# Patient Record
Sex: Female | Born: 1970 | Race: White | Hispanic: No
Health system: Southern US, Community
[De-identification: ages and names within clinical notes are randomized; demographics above are authoritative.]

---

## 2007-08-31 ENCOUNTER — Ambulatory Visit: Payer: Self-pay | Admitting: Gastroenterology

## 2010-09-13 ENCOUNTER — Ambulatory Visit: Payer: Self-pay | Admitting: Family Medicine

## 2011-07-15 ENCOUNTER — Emergency Department: Payer: Self-pay | Admitting: Emergency Medicine

## 2012-02-22 ENCOUNTER — Ambulatory Visit: Payer: Self-pay | Admitting: Family Medicine

## 2012-10-24 ENCOUNTER — Emergency Department: Payer: Self-pay | Admitting: Emergency Medicine

## 2013-08-07 ENCOUNTER — Ambulatory Visit: Payer: Self-pay | Admitting: Family Medicine

## 2013-08-25 ENCOUNTER — Ambulatory Visit: Payer: Self-pay | Admitting: Physician Assistant

## 2013-10-04 ENCOUNTER — Ambulatory Visit: Payer: Self-pay | Admitting: Gastroenterology

## 2017-11-11 ENCOUNTER — Other Ambulatory Visit: Payer: Self-pay | Admitting: Family Medicine

## 2017-11-11 DIAGNOSIS — Z1231 Encounter for screening mammogram for malignant neoplasm of breast: Secondary | ICD-10-CM

## 2017-11-21 ENCOUNTER — Inpatient Hospital Stay: Admission: RE | Admit: 2017-11-21 | Payer: Self-pay | Source: Ambulatory Visit

## 2017-11-29 ENCOUNTER — Ambulatory Visit
Admission: RE | Admit: 2017-11-29 | Discharge: 2017-11-29 | Disposition: A | Discharge status: Home / Self Care | Payer: BLUE CROSS/BLUE SHIELD | Source: Ambulatory Visit | Attending: Family Medicine | Admitting: Family Medicine

## 2017-11-29 ENCOUNTER — Encounter: Payer: Self-pay | Admitting: Radiology

## 2017-11-29 DIAGNOSIS — Z1231 Encounter for screening mammogram for malignant neoplasm of breast: Secondary | ICD-10-CM | POA: Insufficient documentation

## 2017-11-29 DIAGNOSIS — R928 Other abnormal and inconclusive findings on diagnostic imaging of breast: Secondary | ICD-10-CM | POA: Diagnosis not present

## 2017-12-01 ENCOUNTER — Other Ambulatory Visit: Payer: Self-pay | Admitting: Family Medicine

## 2017-12-01 DIAGNOSIS — N6489 Other specified disorders of breast: Secondary | ICD-10-CM

## 2017-12-01 DIAGNOSIS — R928 Other abnormal and inconclusive findings on diagnostic imaging of breast: Secondary | ICD-10-CM

## 2017-12-09 ENCOUNTER — Ambulatory Visit
Admission: RE | Admit: 2017-12-09 | Discharge: 2017-12-09 | Disposition: A | Discharge status: Home / Self Care | Payer: BLUE CROSS/BLUE SHIELD | Source: Ambulatory Visit | Attending: Family Medicine | Admitting: Family Medicine

## 2017-12-09 DIAGNOSIS — N6489 Other specified disorders of breast: Secondary | ICD-10-CM

## 2017-12-09 DIAGNOSIS — R928 Other abnormal and inconclusive findings on diagnostic imaging of breast: Secondary | ICD-10-CM | POA: Diagnosis present

## 2019-04-10 IMAGING — MG MM DIGITAL DIAGNOSTIC BILAT W/ TOMO W/ CAD
8 series · 8 of 24 positions shown · non-contrast
Comparison: Previous exam(s).

CLINICAL DATA: Bilateral breast asymmetry seen on most recent
screening mammography.

EXAM:
DIGITAL DIAGNOSTIC BILATERAL MAMMOGRAM WITH CAD AND TOMO

[R CC synth-2D]
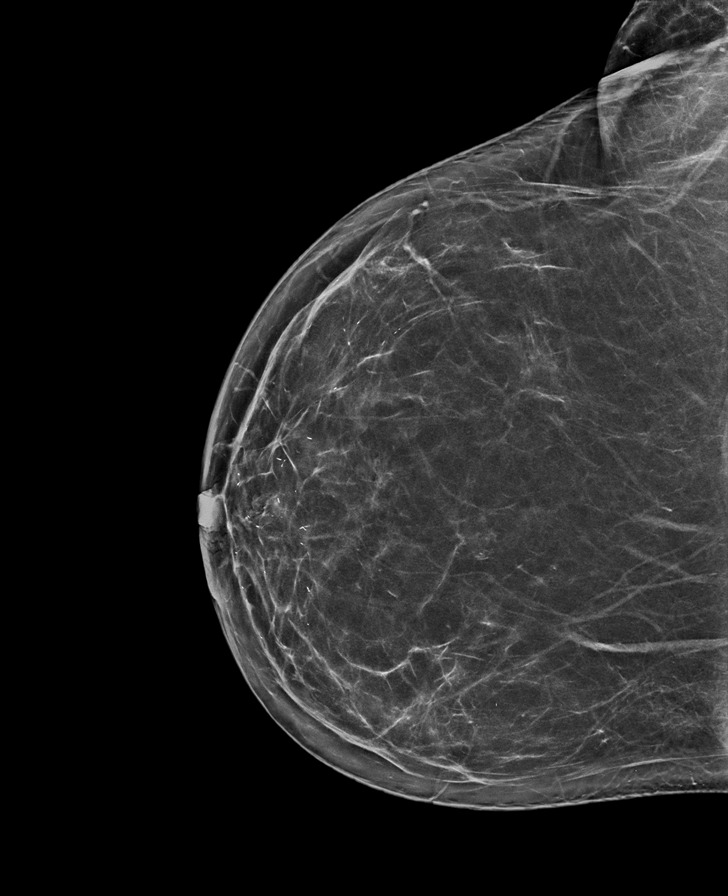

[R MLO synth-2D]
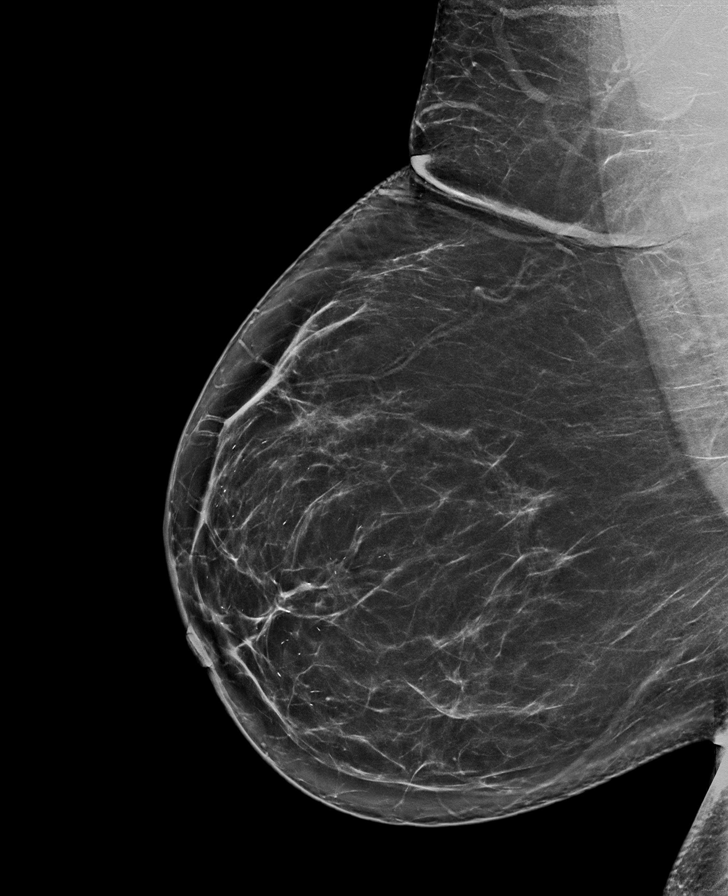

[L MLO synth-2D]
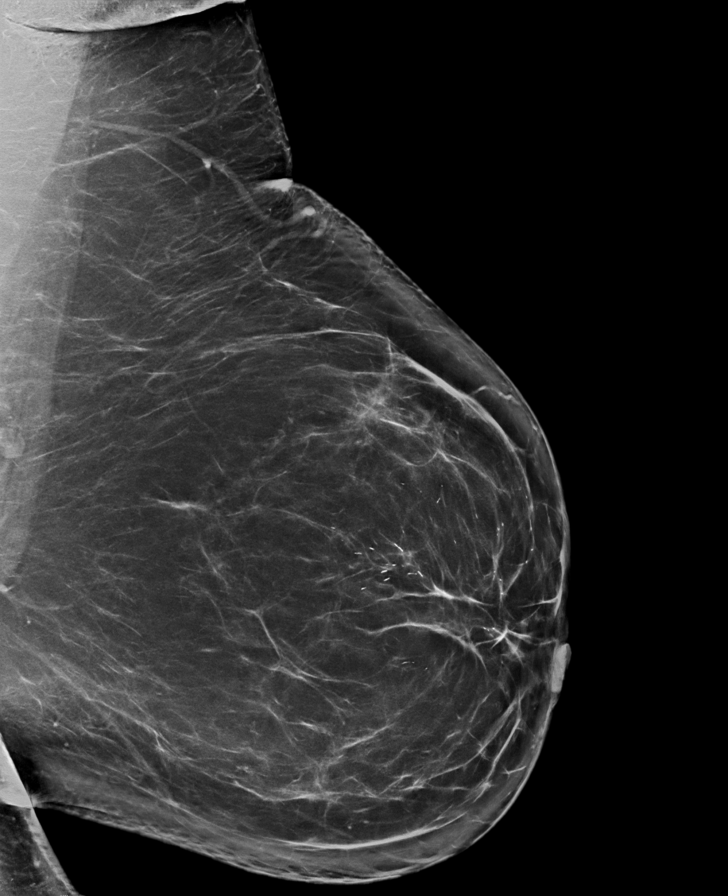

[L CC synth-2D]
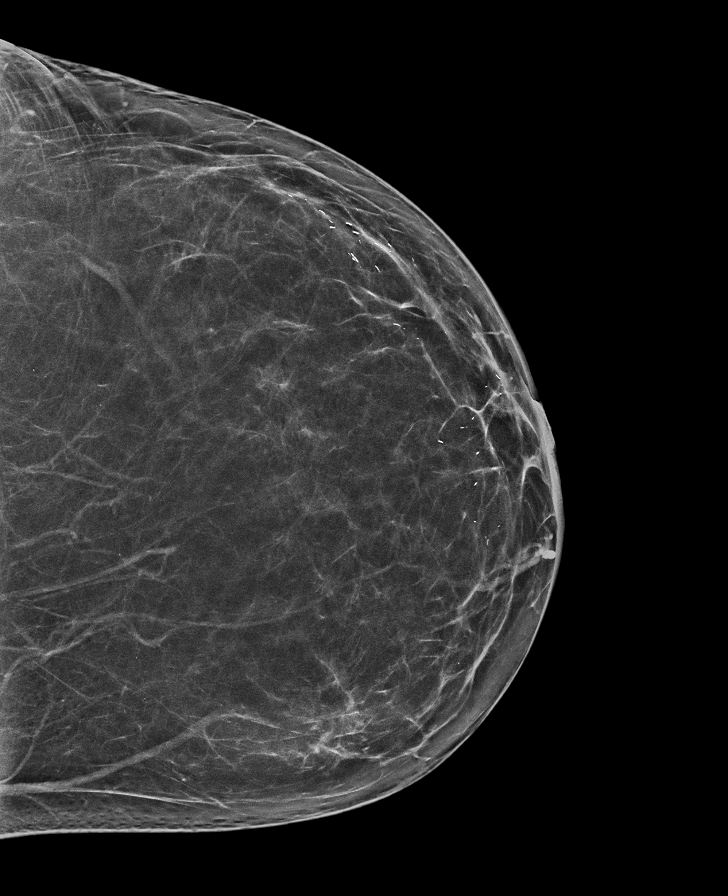

[L CC tomo · tomo slice 39/76.0]
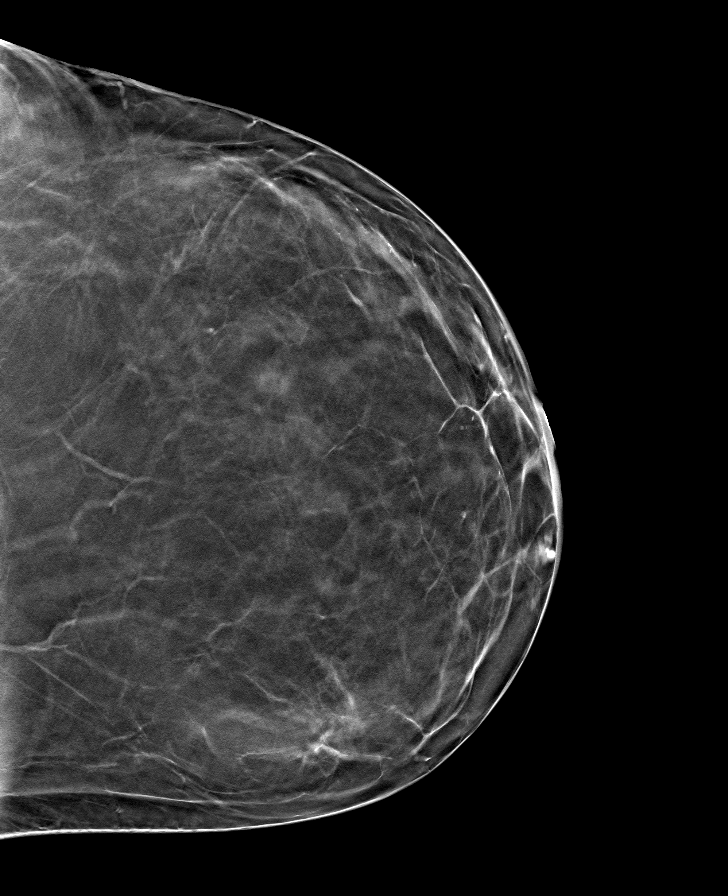

[R CC tomo · tomo slice 39/76.0]
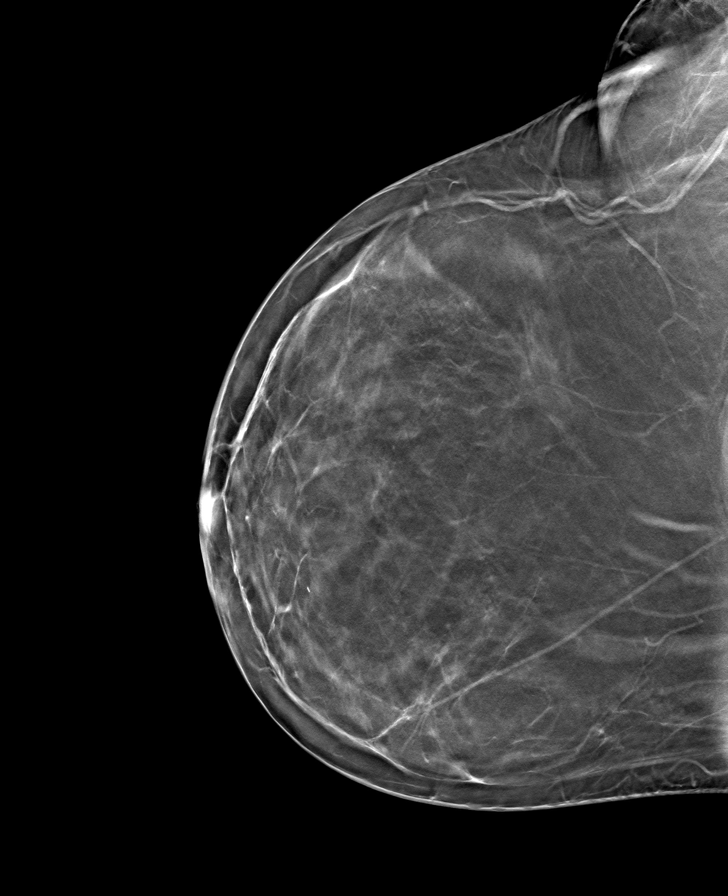

[R MLO tomo · tomo slice 43/85.0]
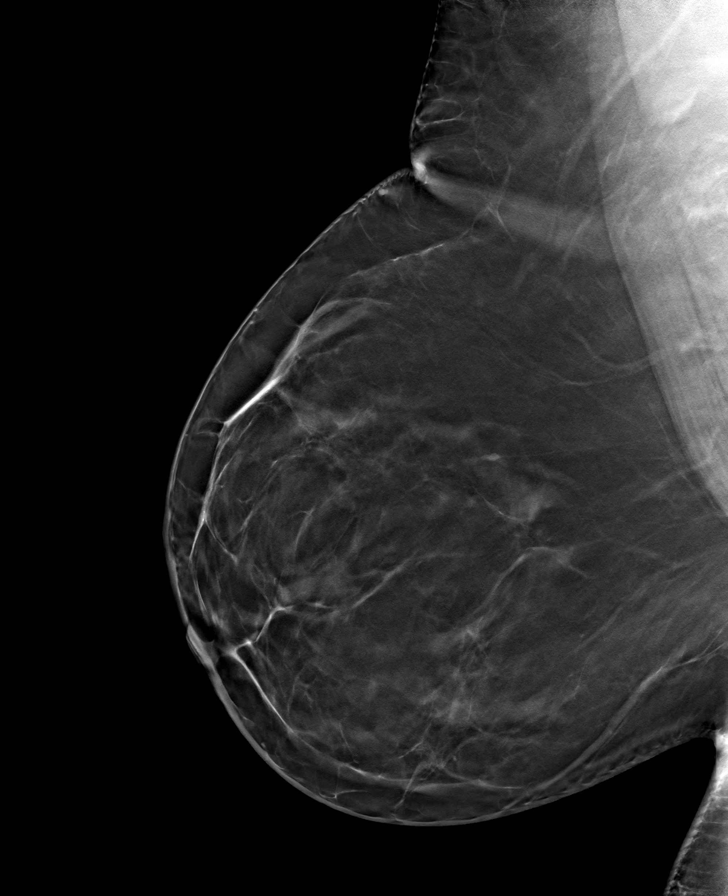

[L MLO tomo · tomo slice 49/96.0]
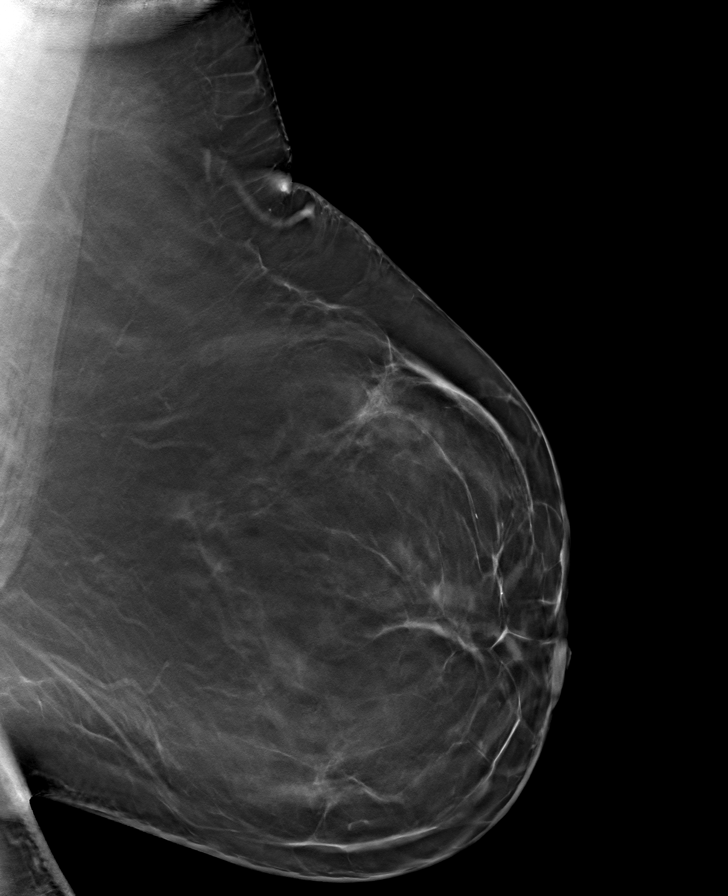

[8 of 24 positions shown; findings below may reference images not displayed]

ACR Breast Density Category b: There are scattered areas of
fibroglandular density.
FINDINGS: Mammographically, there are no suspicious masses, areas of
architectural distortion or microcalcifications in either breast.
The bilateral breast asymmetries noted on the most recent screening
mammogram is stable from patient's mammogram dated 02/22/2012, and
therefore are benign.

Mammographic images were processed with CAD.
IMPRESSION: No mammographic evidence of malignancy in either breast.

RECOMMENDATION:
Screening mammogram in one year.(Code:S8-R-4UD)

I have discussed the findings and recommendations with the patient.
Results were also provided in writing at the conclusion of the
visit. If applicable, a reminder letter will be sent to the patient
regarding the next appointment.

BI-RADS CATEGORY  1: Negative.

## 2019-08-31 ENCOUNTER — Other Ambulatory Visit (HOSPITAL_COMMUNITY): Payer: Self-pay | Admitting: Gastroenterology

## 2019-08-31 ENCOUNTER — Other Ambulatory Visit: Payer: Self-pay | Admitting: Gastroenterology

## 2019-08-31 DIAGNOSIS — R1011 Right upper quadrant pain: Secondary | ICD-10-CM

## 2019-08-31 DIAGNOSIS — R1901 Right upper quadrant abdominal swelling, mass and lump: Secondary | ICD-10-CM

## 2019-09-05 ENCOUNTER — Other Ambulatory Visit: Payer: Self-pay

## 2019-09-05 ENCOUNTER — Ambulatory Visit
Admission: RE | Admit: 2019-09-05 | Discharge: 2019-09-05 | Disposition: A | Discharge status: Home / Self Care | Payer: Managed Care, Other (non HMO) | Source: Ambulatory Visit | Attending: Gastroenterology | Admitting: Gastroenterology

## 2019-09-05 DIAGNOSIS — R1901 Right upper quadrant abdominal swelling, mass and lump: Secondary | ICD-10-CM

## 2019-09-05 DIAGNOSIS — R1011 Right upper quadrant pain: Secondary | ICD-10-CM | POA: Diagnosis not present

## 2021-08-05 ENCOUNTER — Other Ambulatory Visit: Payer: Self-pay | Admitting: Gastroenterology

## 2021-08-05 DIAGNOSIS — R1011 Right upper quadrant pain: Secondary | ICD-10-CM

## 2021-08-11 ENCOUNTER — Other Ambulatory Visit: Payer: Self-pay

## 2021-08-11 ENCOUNTER — Ambulatory Visit
Admission: RE | Admit: 2021-08-11 | Discharge: 2021-08-11 | Disposition: A | Discharge status: Home / Self Care | Payer: Managed Care, Other (non HMO) | Source: Ambulatory Visit | Attending: Gastroenterology | Admitting: Gastroenterology

## 2021-08-11 DIAGNOSIS — R1011 Right upper quadrant pain: Secondary | ICD-10-CM | POA: Insufficient documentation

## 2021-09-09 ENCOUNTER — Other Ambulatory Visit: Payer: Self-pay | Admitting: Gastroenterology

## 2021-09-09 DIAGNOSIS — R932 Abnormal findings on diagnostic imaging of liver and biliary tract: Secondary | ICD-10-CM

## 2021-09-09 DIAGNOSIS — R1011 Right upper quadrant pain: Secondary | ICD-10-CM

## 2021-09-23 ENCOUNTER — Ambulatory Visit
Admission: RE | Admit: 2021-09-23 | Discharge: 2021-09-23 | Disposition: A | Discharge status: Home / Self Care | Payer: Managed Care, Other (non HMO) | Source: Ambulatory Visit | Attending: Gastroenterology | Admitting: Gastroenterology

## 2021-09-23 DIAGNOSIS — R1011 Right upper quadrant pain: Secondary | ICD-10-CM

## 2021-09-23 DIAGNOSIS — R932 Abnormal findings on diagnostic imaging of liver and biliary tract: Secondary | ICD-10-CM

## 2021-10-13 ENCOUNTER — Other Ambulatory Visit: Payer: Self-pay | Admitting: Gerontology

## 2021-10-13 DIAGNOSIS — Z1231 Encounter for screening mammogram for malignant neoplasm of breast: Secondary | ICD-10-CM

## 2021-10-21 ENCOUNTER — Ambulatory Visit
Admission: RE | Admit: 2021-10-21 | Discharge: 2021-10-21 | Disposition: A | Discharge status: Home / Self Care | Payer: Managed Care, Other (non HMO) | Source: Ambulatory Visit | Attending: Gerontology | Admitting: Gerontology

## 2021-10-21 ENCOUNTER — Other Ambulatory Visit: Payer: Self-pay

## 2021-10-21 DIAGNOSIS — Z1231 Encounter for screening mammogram for malignant neoplasm of breast: Secondary | ICD-10-CM | POA: Insufficient documentation

## 2022-10-04 ENCOUNTER — Other Ambulatory Visit: Payer: Self-pay | Admitting: Physical Medicine & Rehabilitation

## 2022-10-04 DIAGNOSIS — M5414 Radiculopathy, thoracic region: Secondary | ICD-10-CM

## 2022-10-04 DIAGNOSIS — M542 Cervicalgia: Secondary | ICD-10-CM

## 2022-10-14 ENCOUNTER — Ambulatory Visit
Admission: RE | Admit: 2022-10-14 | Discharge: 2022-10-14 | Disposition: A | Discharge status: Home / Self Care | Payer: Managed Care, Other (non HMO) | Source: Ambulatory Visit | Attending: Physical Medicine & Rehabilitation | Admitting: Physical Medicine & Rehabilitation

## 2022-10-14 DIAGNOSIS — M5414 Radiculopathy, thoracic region: Secondary | ICD-10-CM

## 2022-10-14 DIAGNOSIS — M542 Cervicalgia: Secondary | ICD-10-CM

## 2022-10-19 ENCOUNTER — Other Ambulatory Visit: Payer: Self-pay | Admitting: Gerontology

## 2022-10-19 DIAGNOSIS — Z1231 Encounter for screening mammogram for malignant neoplasm of breast: Secondary | ICD-10-CM

## 2022-10-20 ENCOUNTER — Other Ambulatory Visit: Payer: Managed Care, Other (non HMO)

## 2022-10-25 ENCOUNTER — Ambulatory Visit
Admission: RE | Admit: 2022-10-25 | Discharge: 2022-10-25 | Disposition: A | Discharge status: Home / Self Care | Payer: Managed Care, Other (non HMO) | Source: Ambulatory Visit | Attending: Gerontology | Admitting: Gerontology

## 2022-10-25 DIAGNOSIS — Z1231 Encounter for screening mammogram for malignant neoplasm of breast: Secondary | ICD-10-CM | POA: Diagnosis not present

## 2022-11-15 ENCOUNTER — Encounter: Payer: Self-pay | Admitting: Physical Medicine & Rehabilitation

## 2022-11-15 ENCOUNTER — Other Ambulatory Visit: Payer: Self-pay | Admitting: Physical Medicine & Rehabilitation

## 2022-11-15 DIAGNOSIS — M5010 Cervical disc disorder with radiculopathy, unspecified cervical region: Secondary | ICD-10-CM

## 2022-11-15 DIAGNOSIS — M5412 Radiculopathy, cervical region: Secondary | ICD-10-CM

## 2022-11-17 ENCOUNTER — Ambulatory Visit
Admission: RE | Admit: 2022-11-17 | Discharge: 2022-11-17 | Discharge status: Home / Self Care | Payer: Managed Care, Other (non HMO) | Source: Ambulatory Visit | Attending: Physical Medicine & Rehabilitation | Admitting: Physical Medicine & Rehabilitation

## 2022-11-17 DIAGNOSIS — M5010 Cervical disc disorder with radiculopathy, unspecified cervical region: Secondary | ICD-10-CM

## 2022-11-17 DIAGNOSIS — M5412 Radiculopathy, cervical region: Secondary | ICD-10-CM

## 2022-11-17 MED ORDER — TRIAMCINOLONE ACETONIDE 40 MG/ML IJ SUSP (RADIOLOGY)
60.0000 mg | Freq: Once | INTRAMUSCULAR | Status: AC
  Administered 2022-11-17: 60 mg via EPIDURAL

## 2022-11-17 MED ORDER — IOPAMIDOL (ISOVUE-M 300) INJECTION 61%
1.0000 mL | Freq: Once | INTRAMUSCULAR | Status: AC | PRN
  Administered 2022-11-17: 1 mL via EPIDURAL

## 2022-11-17 NOTE — Discharge Instructions (Signed)

## 2023-06-16 ENCOUNTER — Other Ambulatory Visit: Payer: Self-pay

## 2023-06-16 ENCOUNTER — Emergency Department: Payer: Managed Care, Other (non HMO)

## 2023-06-16 DIAGNOSIS — I4891 Unspecified atrial fibrillation: Secondary | ICD-10-CM | POA: Insufficient documentation

## 2023-06-16 DIAGNOSIS — I4892 Unspecified atrial flutter: Secondary | ICD-10-CM | POA: Insufficient documentation

## 2023-06-16 DIAGNOSIS — R002 Palpitations: Secondary | ICD-10-CM | POA: Diagnosis present

## 2023-06-16 LAB — CBC
HCT: 45.4 % (ref 36.0–46.0)
Hemoglobin: 15.5 g/dL — ABNORMAL HIGH (ref 12.0–15.0)
MCH: 28.8 pg (ref 26.0–34.0)
MCHC: 34.1 g/dL (ref 30.0–36.0)
MCV: 84.4 fL (ref 80.0–100.0)
Platelets: 226 10*3/uL (ref 150–400)
RBC: 5.38 MIL/uL — ABNORMAL HIGH (ref 3.87–5.11)
RDW: 13.2 % (ref 11.5–15.5)
WBC: 8.1 10*3/uL (ref 4.0–10.5)
nRBC: 0 % (ref 0.0–0.2)

## 2023-06-16 LAB — URINALYSIS, ROUTINE W REFLEX MICROSCOPIC
Bilirubin Urine: NEGATIVE
Glucose, UA: NEGATIVE mg/dL
Hgb urine dipstick: NEGATIVE
Ketones, ur: NEGATIVE mg/dL
Leukocytes,Ua: NEGATIVE
Nitrite: NEGATIVE
Protein, ur: NEGATIVE mg/dL
Specific Gravity, Urine: 1.001 — ABNORMAL LOW (ref 1.005–1.030)
pH: 8 (ref 5.0–8.0)

## 2023-06-16 NOTE — ED Triage Notes (Signed)
Pt reports acute onset CP and SOB 30 min pta. Reports concern for panic attack but wants to be sure. Pt ambulatory to triage. Alert and oriented. Breathing unlabored.

## 2023-06-17 ENCOUNTER — Emergency Department
Admission: EM | Admit: 2023-06-17 | Discharge: 2023-06-17 | Disposition: A | Discharge status: Home / Self Care | Payer: Managed Care, Other (non HMO) | Attending: Emergency Medicine | Admitting: Emergency Medicine

## 2023-06-17 ENCOUNTER — Other Ambulatory Visit: Payer: Self-pay

## 2023-06-17 DIAGNOSIS — I4891 Unspecified atrial fibrillation: Secondary | ICD-10-CM

## 2023-06-17 LAB — COMPREHENSIVE METABOLIC PANEL
ALT: 27 U/L (ref 0–44)
AST: 23 U/L (ref 15–41)
Albumin: 4.1 g/dL (ref 3.5–5.0)
Alkaline Phosphatase: 54 U/L (ref 38–126)
Anion gap: 10 (ref 5–15)
BUN: 18 mg/dL (ref 6–20)
CO2: 22 mmol/L (ref 22–32)
Calcium: 9 mg/dL (ref 8.9–10.3)
Chloride: 108 mmol/L (ref 98–111)
Creatinine, Ser: 0.68 mg/dL (ref 0.44–1.00)
GFR, Estimated: >60 mL/min (ref >60)
Glucose, Bld: 101 mg/dL — ABNORMAL HIGH (ref 70–99)
Potassium: 4.2 mmol/L (ref 3.5–5.1)
Sodium: 140 mmol/L (ref 135–145)
Total Bilirubin: 0.8 mg/dL (ref 0.3–1.2)
Total Protein: 7.1 g/dL (ref 6.5–8.1)

## 2023-06-17 LAB — TROPONIN I (HIGH SENSITIVITY)
Troponin I (High Sensitivity): 10 ng/L (ref <18)
Troponin I (High Sensitivity): 9 ng/L (ref <18)

## 2023-06-17 LAB — PREGNANCY, URINE: Preg Test, Ur: NEGATIVE

## 2023-06-17 LAB — TSH: TSH: 3.099 u[IU]/mL (ref 0.350–4.500)

## 2023-06-17 LAB — T4, FREE: Free T4: 1.15 ng/dL — ABNORMAL HIGH (ref 0.61–1.12)

## 2023-06-17 NOTE — ED Provider Notes (Signed)
[facility] Provider Note    Event Date/Time   First MD Initiated Contact with Patient 06/17/23 [phone]     (approximate)   History   Chest Pain and Shortness of Breath   HPI  Maureen Hale is a 52 y.o. female past medical history significant for obesity who presents to the emergency department with heart palpitations and shortness of breath.  Started having heart palpitations last night and felt like her heart was racing and fluttering with heart rate that increased up to 150.  Checked her blood pressure at home and it was significantly elevated at 190/110 which is abnormal for her.  Was feeling short of breath so she came into the emergency department.  Since being in the emergency department no longer with heart palpitations or tachycardia.  No chest pain or shortness of breath.  Does endorse a family history of atrial fibrillation.  Concern for sudden cardiac death of her father at age 56.  Denies any estrogen replacement.  No history of DVT or PE.  Denies any unexplained weight loss.  No hot or cold intolerance.  No nausea, vomiting, diarrhea.  States that she has had intermittent episodes of heart palpitations in the past but they only last for couple of seconds and then resolved.  This been continued for at least 3 hours.  Takes a supplement in the morning that has caffeine in it.  Otherwise denies any nicotine use or significant caffeine or any to drink use.     Physical Exam   Triage Vital Signs: ED Triage Vitals  Encounter Vitals Group     BP 06/16/23 2301 (!) 168/109     Systolic BP Percentile --      Diastolic BP Percentile --      Pulse Rate 06/16/23 2301 (!) 128     Resp 06/16/23 2301 (!) 22     Temp 06/16/23 2301 98.3 F (36.8 C)     Temp Source 06/16/23 2301 Oral     SpO2 06/16/23 2301 100 %     Weight 06/16/23 2259 240 lb (108.9 kg)     Height 06/16/23 2259 5\' 6"  (1.676 m)     Head Circumference --      Peak Flow --      Pain Score  06/16/23 2259 6     Pain Loc --      Pain Education --      Exclude from Growth Chart --     Most recent vital signs: Vitals:   06/17/23 0700 06/17/23 0830  BP: 122/70 139/73  Pulse: (!) 57 60  Resp: 12 19  Temp:    SpO2: 100% 100%    Physical Exam Constitutional:      Appearance: She is well-developed.  HENT:     Head: Atraumatic.  Eyes:     Conjunctiva/sclera: Conjunctivae normal.  Cardiovascular:     Rate and Rhythm: Regular rhythm.     Heart sounds: Normal heart sounds. No murmur heard. Pulmonary:     Effort: No respiratory distress.  Abdominal:     General: There is no distension.     Palpations: Abdomen is soft.  Musculoskeletal:        General: Normal range of motion.     Cervical back: Normal range of motion.     Right lower leg: No edema.     Left lower leg: No edema.  Skin:    General: Skin is warm.     Capillary Refill: Capillary  refill takes less than 2 seconds.  Neurological:     Mental Status: She is alert. Mental status is at baseline.     IMPRESSION / MDM / ASSESSMENT AND PLAN / ED COURSE  I reviewed the triage vital signs and the nursing notes.  Differential diagnosis including electrolyte abnormality, SVT, thyroid disorder, atrial fibrillation/flutter EKG  I, Corena Herter, the attending physician, personally viewed and interpreted this ECG. Supraventricular tachycardia with a heart rate of 136.  No tachycardic or bradycardic dysrhythmias while on cardiac telemetry.  RADIOLOGY I independently reviewed imaging, my interpretation of imaging: No acute findings  LABS (all labs ordered are listed, but only abnormal results are displayed) Labs interpreted as -    Labs Reviewed  CBC - Abnormal; Notable for the following components:      Result Value   RBC 5.38 (*)    Hemoglobin 15.5 (*)    All other components within normal limits  URINALYSIS, ROUTINE W REFLEX MICROSCOPIC - Abnormal; Notable for the following components:   Color, Urine  COLORLESS (*)    APPearance CLEAR (*)    Specific Gravity, Urine 1.001 (*)    All other components within normal limits  COMPREHENSIVE METABOLIC PANEL - Abnormal; Notable for the following components:   Glucose, Bld 101 (*)    All other components within normal limits  T4, FREE - Abnormal; Notable for the following components:   Free T4 1.15 (*)    All other components within normal limits  PREGNANCY, URINE  TSH  TROPONIN I (HIGH SENSITIVITY)  TROPONIN I (HIGH SENSITIVITY)     MDM    Troponin negative, have a low suspicion for ACS.  Patient converted back into normal sinus rhythm on her own.  Repeat EKG with lead reversal however on recheck does not have lead reversal.  No signs of ST changes.  Normal P waves.  Mildly elevated free T4.  Normal TSH.  Discussed this finding with the patient and the need to follow-up with her primary care physician.  Discussed the patient's case with Dr. Okey Dupre with cardiology, concerns for underlying atrial fibrillation that would be new onset.  Patient has a CHA2DS2-VASc score of 1.  Does not meet criteria for anticoagulation.  Recommended discharge home with outpatient follow-up with cardiology.  Stated that he will talk to the scheduler to set her up as an outpatient with EP.  Patient states that [facility], given information to follow-up with nodal cardiology.  Given return precautions for recurrence of symptoms.   PROCEDURES:  Critical Care performed: No  Procedures  Patient's presentation is most consistent with acute presentation with potential threat to life or bodily function.   MEDICATIONS ORDERED IN ED: Medications - No data to display  FINAL CLINICAL IMPRESSION(S) / ED DIAGNOSES   Final diagnoses:  Atrial fib/flutter, transient (HCC)     Rx / DC Orders   ED Discharge Orders     None        Note:  This document was prepared using Dragon voice recognition software and may include unintentional dictation  errors.   Corena Herter, MD 06/17/23 1149

## 2023-06-22 ENCOUNTER — Other Ambulatory Visit: Payer: Self-pay | Admitting: Internal Medicine

## 2023-06-22 DIAGNOSIS — I4891 Unspecified atrial fibrillation: Secondary | ICD-10-CM

## 2023-06-29 ENCOUNTER — Ambulatory Visit
Admission: RE | Admit: 2023-06-29 | Discharge: 2023-06-29 | Disposition: A | Discharge status: Home / Self Care | Payer: Managed Care, Other (non HMO) | Source: Ambulatory Visit | Attending: Internal Medicine | Admitting: Internal Medicine

## 2023-06-29 DIAGNOSIS — I4891 Unspecified atrial fibrillation: Secondary | ICD-10-CM | POA: Insufficient documentation

## 2023-10-19 ENCOUNTER — Other Ambulatory Visit: Payer: Self-pay | Admitting: Gerontology

## 2023-10-19 DIAGNOSIS — Z1231 Encounter for screening mammogram for malignant neoplasm of breast: Secondary | ICD-10-CM

## 2023-10-27 ENCOUNTER — Ambulatory Visit
Admission: RE | Admit: 2023-10-27 | Discharge: 2023-10-27 | Disposition: A | Discharge status: Home / Self Care | Payer: Managed Care, Other (non HMO) | Source: Ambulatory Visit | Attending: Gerontology | Admitting: Gerontology

## 2023-10-27 DIAGNOSIS — Z1231 Encounter for screening mammogram for malignant neoplasm of breast: Secondary | ICD-10-CM | POA: Diagnosis present
